# Patient Record
Sex: Male | Born: 2000 | Race: White | Hispanic: No | Marital: Single | Smoking: Never smoker
Health system: Southern US, Community
[De-identification: ages and names within clinical notes are randomized; demographics above are authoritative.]

## PROBLEM LIST (undated history)

## (undated) DIAGNOSIS — F952 Tourette's disorder: Secondary | ICD-10-CM

---
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
## 2020-11-03 ENCOUNTER — Emergency Department (INDEPENDENT_AMBULATORY_CARE_PROVIDER_SITE_OTHER)
Admission: EM | Admit: 2020-11-03 | Discharge: 2020-11-03 | Disposition: A | Discharge status: Home / Self Care | Payer: Commercial Managed Care - PPO | Source: Home / Self Care | Attending: Family Medicine | Admitting: Family Medicine

## 2020-11-03 DIAGNOSIS — M79604 Pain in right leg: Secondary | ICD-10-CM

## 2020-11-03 DIAGNOSIS — S86911A Strain of unspecified muscle(s) and tendon(s) at lower leg level, right leg, initial encounter: Secondary | ICD-10-CM

## 2020-11-03 MED ORDER — IBUPROFEN 800 MG PO TABS: 800.0000 mg | ORAL_TABLET | Freq: Three times a day (TID) | ORAL | 0 refills | Status: AC

## 2020-11-03 NOTE — ED Triage Notes (Addendum)
Pt c/o RT leg pain x 1 week. Says he thought it might be shin splints. Hurts to run, sometimes drive. Stiff in mornings. Says he does a lot of walking while at work. Marvin Sanders has been going to gym a lot more regularly. Ibuprofen prn. Pain 2/10 just sitting, hurts more when he bends foot upward.

## 2020-11-03 NOTE — Discharge Instructions (Addendum)
Avoid physical exercise with leg.  May perform standing and walking activities Use ice or heat, may alternate Take ibuprofen 3 times a day with food Gently rub the muscle See sports medicine if not improving by next week

## 2020-11-03 NOTE — ED Provider Notes (Signed)
[facility]    CSN: [id number] Arrival date & time: 11/03/20  1706      History   Chief Complaint Chief Complaint  Patient presents with   Leg Pain    RT    HPI Marvin Sanders is a 20 y.o. male.   HPI  Marvin Sanders has pain in his right lower leg.  Is been present for over a week.  Started after he worked out in a gym.  He does not remember any accident or injury, no slip or fall, no new shoes, no new activity.  He has been trying over-the-counter Advil.  Some ice.  Limiting exercise.  His leg still hurts.  No numbness or weakness.  Past Medical History:  Diagnosis Date   Tourette syndrome     There are no problems to display for this patient.   History reviewed. No pertinent surgical history.     Home Medications    Prior to Admission medications   Medication Sig Start Date End Date Taking? Authorizing Provider  ibuprofen (ADVIL) 800 MG tablet Take 1 tablet (800 mg total) by mouth 3 (three) times daily. 11/03/20  Yes Eustace Moore, MD    Family History History reviewed. No pertinent family history.  Social History Social History   Tobacco Use   Smoking status: Never  Substance Use Topics   Alcohol use: No   Drug use: No     Allergies   Patient has no known allergies.   Review of Systems Review of Systems See HPI  Physical Exam Triage Vital Signs ED Triage Vitals  Enc Vitals Group     BP 11/03/20 1719 120/74     Pulse Rate 11/03/20 1719 82     Resp 11/03/20 1719 18     Temp 11/03/20 1719 98.4 F (36.9 C)     Temp Source 11/03/20 1719 Oral     SpO2 11/03/20 1719 98 %     Weight --      Height --      Head Circumference --      Peak Flow --      Pain Score 11/03/20 1721 2     Pain Loc --      Pain Edu? --      Excl. in GC? --    No data found.  Updated Vital Signs BP 120/74 (BP Location: Right Arm)   Pulse 82   Temp 98.4 F (36.9 C) (Oral)   Resp 18   SpO2 98%     Physical Exam Constitutional:      General: He  is not in acute distress.    Appearance: He is well-developed and normal weight.  HENT:     Head: Normocephalic and atraumatic.     Mouth/Throat:     Comments: Mask is in place Eyes:     Conjunctiva/sclera: Conjunctivae normal.     Pupils: Pupils are equal, round, and reactive to light.  Cardiovascular:     Rate and Rhythm: Normal rate.  Pulmonary:     Effort: Pulmonary effort is normal. No respiratory distress.  Abdominal:     General: There is no distension.     Palpations: Abdomen is soft.  Musculoskeletal:        General: Normal range of motion.     Cervical back: Normal range of motion.     Comments: Both legs appear normal.  No swelling or discoloration.  The right lower extremity has tenderness along the anterior tibialis muscle and  pain with foot dorsiflexion and external rotation.  Reflexes, strength, sensation intact in both lower extremities.  Patient can stand on his heels and toes without limitation  Skin:    General: Skin is warm and dry.  Neurological:     Mental Status: He is alert.     UC Treatments / Results  Labs (all labs ordered are listed, but only abnormal results are displayed) Labs Reviewed - No data to display  EKG   Radiology No results found.  Procedures Procedures (including critical care time)  Medications Ordered in UC Medications - No data to display  Initial Impression / Assessment and Plan / UC Course  I have reviewed the triage vital signs and the nursing notes.  Pertinent labs & imaging results that were available during my care of the patient were reviewed by me and considered in my medical decision making (see chart for details).     Muscular pain in right anterior tibialis muscle.  No neurologic compromise.  I think that he strained the muscle and needs additional rest and ice and anti-inflammatories.  See sports medicine if fails to improve Final Clinical Impressions(s) / UC Diagnoses   Final diagnoses:  Right leg pain   Muscle strain of right lower leg, initial encounter     Discharge Instructions      Avoid physical exercise with leg.  May perform standing and walking activities Use ice or heat, may alternate Take ibuprofen 3 times a day with food Gently rub the muscle See sports medicine if not improving by next week   ED Prescriptions     Medication Sig Dispense Auth. Provider   ibuprofen (ADVIL) 800 MG tablet Take 1 tablet (800 mg total) by mouth 3 (three) times daily. 21 tablet Eustace Moore, MD      PDMP not reviewed this encounter.   Eustace Moore, MD 11/03/20 [phone]
